# Patient Record
Sex: Male | Born: 1985 | Race: White | Hispanic: No | Marital: Single | Smoking: Current every day smoker
Health system: Southern US, Community
[De-identification: ages and names within clinical notes are randomized; demographics above are authoritative.]

## PROBLEM LIST (undated history)

## (undated) DIAGNOSIS — J45909 Unspecified asthma, uncomplicated: Secondary | ICD-10-CM

---

## 2014-01-19 ENCOUNTER — Emergency Department (HOSPITAL_COMMUNITY)
Admission: EM | Admit: 2014-01-19 | Discharge: 2014-01-19 | Disposition: A | Discharge status: Home / Self Care | Payer: Self-pay | Attending: Emergency Medicine | Admitting: Emergency Medicine

## 2014-01-19 ENCOUNTER — Emergency Department (HOSPITAL_COMMUNITY): Payer: Self-pay

## 2014-01-19 ENCOUNTER — Encounter (HOSPITAL_COMMUNITY): Payer: Self-pay | Admitting: Emergency Medicine

## 2014-01-19 DIAGNOSIS — R197 Diarrhea, unspecified: Secondary | ICD-10-CM | POA: Insufficient documentation

## 2014-01-19 DIAGNOSIS — R5381 Other malaise: Secondary | ICD-10-CM | POA: Insufficient documentation

## 2014-01-19 DIAGNOSIS — J029 Acute pharyngitis, unspecified: Secondary | ICD-10-CM | POA: Insufficient documentation

## 2014-01-19 DIAGNOSIS — R059 Cough, unspecified: Secondary | ICD-10-CM | POA: Insufficient documentation

## 2014-01-19 DIAGNOSIS — R5383 Other fatigue: Secondary | ICD-10-CM

## 2014-01-19 DIAGNOSIS — J069 Acute upper respiratory infection, unspecified: Secondary | ICD-10-CM | POA: Insufficient documentation

## 2014-01-19 DIAGNOSIS — R05 Cough: Secondary | ICD-10-CM | POA: Insufficient documentation

## 2014-01-19 DIAGNOSIS — F17200 Nicotine dependence, unspecified, uncomplicated: Secondary | ICD-10-CM | POA: Insufficient documentation

## 2014-01-19 DIAGNOSIS — J45909 Unspecified asthma, uncomplicated: Secondary | ICD-10-CM | POA: Insufficient documentation

## 2014-01-19 HISTORY — DX: Unspecified asthma, uncomplicated: J45.909

## 2014-01-19 LAB — RAPID STREP SCREEN (MED CTR MEBANE ONLY): Streptococcus, Group A Screen (Direct): NEGATIVE

## 2014-01-19 MED ORDER — IPRATROPIUM BROMIDE 0.06 % NA SOLN: 2.0000 | Freq: Four times a day (QID) | NASAL | Status: DC

## 2014-01-19 MED ORDER — GUAIFENESIN-CODEINE 100-10 MG/5ML PO SYRP: 5.0000 mL | ORAL_SOLUTION | Freq: Three times a day (TID) | ORAL | Status: AC | PRN

## 2014-01-19 NOTE — ED Notes (Signed)
Declined W/C at D/C and was escorted to lobby by RN. 

## 2014-01-19 NOTE — ED Provider Notes (Signed)
Chief Complaint   Chief Complaint  Patient presents with  . Cough  . Nasal Congestion    History of Present Illness   Eulan Heyward is a 28 year old male who has had a two-day history of sore throat, nasal congestion with yellow to clear drainage, headache, sinus pressure, weakness, malaise, fatigue, chills, cough productive yellow sputum, chest tightness, wheezing, chest pain, and diarrhea. He has a history of asthma in the past but none recently. He denies any fever or, nausea, or vomiting. No sick exposures.  Review of Systems   Other than as noted above, the patient denies any of the following symptoms: Systemic:  No fevers, chills, sweats, or myalgias. Eye:  No redness or discharge. ENT:  No ear pain, headache, nasal congestion, drainage, sinus pressure, or sore throat. Neck:  No neck pain, stiffness, or swollen glands. Lungs:  No cough, sputum production, hemoptysis, wheezing, chest tightness, shortness of breath or chest pain. GI:  No abdominal pain, nausea, vomiting or diarrhea.  PMFSH   Past medical history, family history, social history, meds, and allergies were reviewed.   Physical exam   Vital signs:  BP 119/76  Pulse 73  Temp(Src) 98.2 F (36.8 C) (Oral)  Resp 12  Ht 6' (1.829 m)  Wt 145 lb (65.772 kg)  BMI 19.66 kg/m2  SpO2 100% General:  Alert and oriented.  In no distress.  Skin warm and dry. Eye:  No conjunctival injection or drainage. Lids were normal. ENT:  TMs and canals were normal, without erythema or inflammation.  Nasal mucosa was clear and uncongested, without drainage.  Mucous membranes were moist.  Pharynx was clear with no exudate or drainage.  There were no oral ulcerations or lesions. Neck:  Supple, no adenopathy, tenderness or mass. Lungs:  No respiratory distress.  Lungs were clear to auscultation, without wheezes, rales or rhonchi.  Breath sounds were clear and equal bilaterally.  Heart:  Regular rhythm, without gallops, murmers or  rubs. Skin:  Clear, warm, and dry, without rash or lesions.  Labs   Results for orders placed during the hospital encounter of 01/19/14  RAPID STREP SCREEN      Result Value Ref Range   Streptococcus, Group A Screen (Direct) NEGATIVE  NEGATIVE     Radiology   Dg Chest 2 View  01/19/2014   CLINICAL DATA:  Productive cough  EXAM: CHEST  2 VIEW  COMPARISON:  None.  FINDINGS: The heart size and mediastinal contours are within normal limits. Both lungs are clear. The visualized skeletal structures are unremarkable.  IMPRESSION: No active cardiopulmonary disease.   Electronically Signed   By: Alcide Clever M.D.   On: 01/19/2014 13:35   Assessment     The encounter diagnosis was Viral URI.  No evidence of pneumonia or strep throat.  Plan    1.  Meds:  The following meds were prescribed:   Discharge Medication List as of 01/19/2014  1:44 PM    START taking these medications   Details  guaiFENesin-codeine (ROBITUSSIN AC) 100-10 MG/5ML syrup Take 5 mLs by mouth 3 (three) times daily as needed for cough., Starting 01/19/2014, Until Discontinued, Print    ipratropium (ATROVENT) 0.06 % nasal spray Place 2 sprays into both nostrils 4 (four) times daily., Starting 01/19/2014, Until Discontinued, Print        2.  Patient Education/Counseling:  The patient was given appropriate handouts, self care instructions, and instructed in symptomatic relief.  Instructed to get extra fluids, rest, and use a  cool mist vaporizer.    3.  Follow up:  The patient was told to follow up here if no better in 3 to 4 days, or sooner if becoming worse in any way, and given some red flag symptoms such as increasing fever, difficulty breathing, chest pain, or persistent vomiting which would prompt immediate return.  Follow up here as needed.      Reuben Likes, MD 01/19/14 2109

## 2014-01-19 NOTE — ED Notes (Signed)
Pt c/o sore throat onset yesterday. Pt c/o cough and congestion onset today. Pt denies being exposed to others with illness.

## 2014-01-19 NOTE — Discharge Instructions (Signed)

## 2014-01-21 LAB — CULTURE, GROUP A STREP

## 2014-07-18 ENCOUNTER — Encounter (HOSPITAL_COMMUNITY): Payer: Self-pay | Admitting: Emergency Medicine

## 2014-07-18 ENCOUNTER — Emergency Department (HOSPITAL_COMMUNITY): Payer: Self-pay

## 2014-07-18 ENCOUNTER — Emergency Department (HOSPITAL_COMMUNITY)
Admission: EM | Admit: 2014-07-18 | Discharge: 2014-07-19 | Disposition: A | Discharge status: Home / Self Care | Payer: Self-pay | Attending: Emergency Medicine | Admitting: Emergency Medicine

## 2014-07-18 DIAGNOSIS — Y998 Other external cause status: Secondary | ICD-10-CM | POA: Insufficient documentation

## 2014-07-18 DIAGNOSIS — J45909 Unspecified asthma, uncomplicated: Secondary | ICD-10-CM | POA: Insufficient documentation

## 2014-07-18 DIAGNOSIS — Z79899 Other long term (current) drug therapy: Secondary | ICD-10-CM | POA: Insufficient documentation

## 2014-07-18 DIAGNOSIS — Y9289 Other specified places as the place of occurrence of the external cause: Secondary | ICD-10-CM | POA: Insufficient documentation

## 2014-07-18 DIAGNOSIS — Z720 Tobacco use: Secondary | ICD-10-CM | POA: Insufficient documentation

## 2014-07-18 DIAGNOSIS — S41111A Laceration without foreign body of right upper arm, initial encounter: Secondary | ICD-10-CM

## 2014-07-18 DIAGNOSIS — Y9389 Activity, other specified: Secondary | ICD-10-CM | POA: Insufficient documentation

## 2014-07-18 DIAGNOSIS — S41101A Unspecified open wound of right upper arm, initial encounter: Secondary | ICD-10-CM | POA: Insufficient documentation

## 2014-07-18 MED ORDER — TETANUS-DIPHTH-ACELL PERTUSSIS 5-2.5-18.5 LF-MCG/0.5 IM SUSP
INTRAMUSCULAR | Status: AC
  Filled 2014-07-18: qty 0.5

## 2014-07-18 MED ORDER — MORPHINE SULFATE 2 MG/ML IJ SOLN
INTRAMUSCULAR | Status: AC
  Filled 2014-07-18: qty 2

## 2014-07-18 MED ORDER — MORPHINE SULFATE 4 MG/ML IJ SOLN
4.0000 mg | Freq: Once | INTRAMUSCULAR | Status: AC
  Administered 2014-07-18: 4 mg via INTRAVENOUS

## 2014-07-18 MED ORDER — TETANUS-DIPHTH-ACELL PERTUSSIS 5-2.5-18.5 LF-MCG/0.5 IM SUSP: 0.5000 mL | Freq: Once | INTRAMUSCULAR | Status: DC

## 2014-07-18 MED ORDER — MORPHINE SULFATE 2 MG/ML IJ SOLN
INTRAMUSCULAR | Status: AC
  Administered 2014-07-18: 2 mg via INTRAMUSCULAR
  Filled 2014-07-18: qty 1
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
## 2014-07-19 MED ORDER — HYDROCODONE-ACETAMINOPHEN 5-325 MG PO TABS: 1.0000 | ORAL_TABLET | ORAL | Status: AC | PRN

## 2014-07-19 NOTE — Discharge Instructions (Signed)
Stab Wound °A stab wound occurs when a sharp object, such as a knife, penetrates the body. Stab wounds can cause bleeding as well as damage to organs and tissues in the area of the wound. They can also lead to infection. The amount of damage depends on the location of the injury and how deep the sharp object penetrated the body.  °DIAGNOSIS  °A stab wound is usually diagnosed by your history and a physical exam. X-rays, an ultrasound exam, or other imaging studies may be done to check for foreign bodies in the wound and to determine the extent of damage. °TREATMENT  °Many times, stab wounds can be treated by cleaning the wound area and applying a sterile bandage (dressing). Stitches (sutures), skin adhesive strips, or staples may be used to close some stab wounds. Antibiotic treatment may be prescribed to help prevent infection. Depending on the stab wound and its location, you may require surgery. This is especially true for many wounds to the chest, back, abdomen, and neck. Stab wounds to these areas require immediate medical care. You may be given a tetanus shot if needed. °HOME CARE INSTRUCTIONS °· Rest the injured body part for the next 2-3 days or as directed by your health care provider. °· If possible, keep the injured area elevated to reduce pain and swelling. °· Keep the area clean and dry. Remove or change any dressings as instructed by your health care provider. °· Only take over-the-counter or prescription medicines as directed by your health care provider. °· If antibiotics were prescribed, take them as directed. Finish them even if you start to feel better. °· Keep all follow-up appointments. A follow-up exam is usually needed to recheck the injury within 2-3 days. °SEEK IMMEDIATE MEDICAL CARE IF: °· You have shortness of breath. °· You have severe chest or abdominal pain. °· You pass out (faint) or feel as if you may pass out. °· You have uncontrolled bleeding. °· You have chills or a fever. °· You  have nausea or vomiting. °· You have redness, swelling, increasing pain, or drainage of pus at the site of the wound. °· You have numbness or weakness in the injured area. This may be a sign of damage to an underlying nerve or tendon. °MAKE SURE YOU: °· Understand these instructions. °· Will watch your condition. °· Will get help right away if you are not doing well or get worse. °Document Released: 06/17/2004 Document Revised: 05/15/2013 Document Reviewed: 01/15/2013 °[employer]® Patient Information ©2015 [employer]. This information is not intended to replace advice given to you by your health care provider. Make sure you discuss any questions you have with your health care provider. ° ° °

## 2014-07-19 NOTE — ED Notes (Signed)
Pt transported to xray 
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
